# Patient Record
Sex: Female | Born: 1975 | Race: White | Hispanic: No | Marital: Married | Smoking: Never smoker
Health system: Southern US, Community
[De-identification: ages and names within clinical notes are randomized; demographics above are authoritative.]

## PROBLEM LIST (undated history)

## (undated) DIAGNOSIS — G43909 Migraine, unspecified, not intractable, without status migrainosus: Secondary | ICD-10-CM

## (undated) DIAGNOSIS — E039 Hypothyroidism, unspecified: Secondary | ICD-10-CM

## (undated) DIAGNOSIS — F419 Anxiety disorder, unspecified: Secondary | ICD-10-CM

---

## 1997-06-13 ENCOUNTER — Emergency Department (HOSPITAL_COMMUNITY): Admission: EM | Admit: 1997-06-13 | Discharge: 1997-06-13 | Payer: Self-pay | Admitting: Emergency Medicine

## 1998-08-16 ENCOUNTER — Other Ambulatory Visit: Admission: RE | Admit: 1998-08-16 | Discharge: 1998-08-16 | Payer: Self-pay | Admitting: *Deleted

## 2003-06-17 ENCOUNTER — Other Ambulatory Visit: Admission: RE | Admit: 2003-06-17 | Discharge: 2003-06-17 | Payer: Self-pay | Admitting: Obstetrics and Gynecology

## 2005-04-12 ENCOUNTER — Ambulatory Visit (HOSPITAL_BASED_OUTPATIENT_CLINIC_OR_DEPARTMENT_OTHER): Admission: RE | Admit: 2005-04-12 | Discharge: 2005-04-12 | Payer: Self-pay | Admitting: Family Medicine

## 2005-04-22 ENCOUNTER — Ambulatory Visit: Payer: Self-pay | Admitting: Internal Medicine

## 2009-02-28 ENCOUNTER — Encounter (INDEPENDENT_AMBULATORY_CARE_PROVIDER_SITE_OTHER): Payer: Self-pay | Admitting: Obstetrics & Gynecology

## 2009-02-28 ENCOUNTER — Inpatient Hospital Stay (HOSPITAL_COMMUNITY): Admission: AD | Admit: 2009-02-28 | Discharge: 2009-03-02 | Payer: Self-pay | Admitting: Obstetrics and Gynecology

## 2009-03-04 ENCOUNTER — Encounter
Admission: RE | Admit: 2009-03-04 | Discharge: 2009-04-03 | Payer: Self-pay | Source: Home / Self Care | Admitting: Certified Nurse Midwife

## 2010-04-19 LAB — CBC
MCHC: 34.2 g/dL (ref 30.0–36.0)
Platelets: 274 10*3/uL (ref 150–400)
RDW: 12.8 % (ref 11.5–15.5)

## 2010-06-16 NOTE — Procedures (Signed)
NAME:  Vicki Castro, Vicki Castro                  ACCOUNT NO.:  [id number]   MEDICAL RECORD NO.:  [id number]          PATIENT TYPE:  OUT   LOCATION:  SLEEP CENTER                 FACILITY:  [facility]   PHYSICIAN:  Clinton D. Maple Hudson, M.D. DATE OF BIRTH:  February 27, 1975   DATE OF STUDY:  04/12/2005                              NOCTURNAL POLYSOMNOGRAM   REFERRING PHYSICIAN:  Dr. Merri Brunette.   DATE OF STUDY:  April 12, 2005.   INDICATION FOR STUDY:  Hypersomnia with sleep apnea.   EPWORTH SLEEPINESS SCORE:  12/24.   BMI:  21.9.   WEIGHT:  140 pounds.   HOME MEDICATIONS:  Fluoxetine, Aviane.   SLEEP ARCHITECTURE:  Total sleep time 380 minutes with sleep efficiency 94%.  Stage I was 2%, stage II 61%, stages III and IV 14%, REM 24% of total sleep  time.  Sleep latency 19 minutes, REM latency 86 minutes, awake after sleep  onset 6 minutes, arousal index showed increased fragmentation at 33 per  hour.  No bedtime medication taken.   RESPIRATORY DATA:  Apnea/hypopnea index (AHI, RDI) 1.4 obstructive events  per hour which is within normal limits (normal range 0/5 per hour).  There  were 4 obstructive apneas and 5 hypopneas.  Most events were reported while  supine sleeping supine.  REM AHI 0.   OXYGEN DATA:  Moderate snoring and mouth breathing with oxygen desaturation  to a nadir of 94%.  Mean oxygen saturation through the study was 98% on room  air.   CARDIAC DATA:  Normal sinus rhythm.   MOVEMENT/PARASOMNIA:  A total of 351 limb jerks were reported of which 37  were associated with arousal or awakening for a periodic limb movement with  arousal index of 5.8 per hour which is increased.   IMPRESSION/RECOMMENDATIONS:  1.  Unremarkable sleep architecture except for increased brief fragmentation      most clearly associated with limb jerks.  2.  Occasional sleep disorder breathing events well within normal limits,      AHI 1.4 per hour with moderate snoring and mouth breathing, normal  oxygenation.  3.  Periodic limb movement with arousal, 5.8 per hour.  Consider specific      therapy including Requip or Mirapex if appropriate.  Recognize that      antidepressant medications will occasionally increase the frequency of      periodic limb movement.      Clinton D. Maple Hudson, M.D.  Diplomate, Biomedical engineer of Sleep Medicine  Electronically Signed     CDY/MEDQ  D:  04/22/2005 11:33:42  T:  04/24/2005 00:25:02  Job:  [id number]

## 2017-05-02 ENCOUNTER — Other Ambulatory Visit: Payer: Self-pay | Admitting: Family Medicine

## 2017-05-02 DIAGNOSIS — Z139 Encounter for screening, unspecified: Secondary | ICD-10-CM

## 2017-06-03 ENCOUNTER — Ambulatory Visit
Admission: RE | Admit: 2017-06-03 | Discharge: 2017-06-03 | Disposition: A | Discharge status: Home / Self Care | Payer: Managed Care, Other (non HMO) | Source: Ambulatory Visit | Attending: Family Medicine | Admitting: Family Medicine

## 2017-06-03 DIAGNOSIS — Z139 Encounter for screening, unspecified: Secondary | ICD-10-CM

## 2018-08-12 ENCOUNTER — Other Ambulatory Visit: Payer: Self-pay | Admitting: Family Medicine

## 2018-08-12 DIAGNOSIS — Z1231 Encounter for screening mammogram for malignant neoplasm of breast: Secondary | ICD-10-CM

## 2018-10-20 ENCOUNTER — Ambulatory Visit
Admission: RE | Admit: 2018-10-20 | Discharge: 2018-10-20 | Disposition: A | Discharge status: Home / Self Care | Payer: Self-pay | Source: Ambulatory Visit | Attending: Family Medicine | Admitting: Family Medicine

## 2018-10-20 ENCOUNTER — Other Ambulatory Visit: Payer: Self-pay

## 2018-10-20 DIAGNOSIS — Z1231 Encounter for screening mammogram for malignant neoplasm of breast: Secondary | ICD-10-CM

## 2018-10-22 ENCOUNTER — Other Ambulatory Visit: Payer: Self-pay | Admitting: Family Medicine

## 2018-10-22 DIAGNOSIS — R928 Other abnormal and inconclusive findings on diagnostic imaging of breast: Secondary | ICD-10-CM

## 2018-10-24 ENCOUNTER — Ambulatory Visit: Payer: Self-pay

## 2018-10-24 ENCOUNTER — Other Ambulatory Visit: Payer: Self-pay | Admitting: Family Medicine

## 2018-10-24 ENCOUNTER — Ambulatory Visit
Admission: RE | Admit: 2018-10-24 | Discharge: 2018-10-24 | Disposition: A | Discharge status: Home / Self Care | Payer: Managed Care, Other (non HMO) | Source: Ambulatory Visit | Attending: Family Medicine | Admitting: Family Medicine

## 2018-10-24 ENCOUNTER — Other Ambulatory Visit: Payer: Self-pay

## 2018-10-24 DIAGNOSIS — N6489 Other specified disorders of breast: Secondary | ICD-10-CM

## 2018-10-24 DIAGNOSIS — R928 Other abnormal and inconclusive findings on diagnostic imaging of breast: Secondary | ICD-10-CM

## 2018-10-31 ENCOUNTER — Other Ambulatory Visit: Payer: Self-pay

## 2018-10-31 ENCOUNTER — Ambulatory Visit
Admission: RE | Admit: 2018-10-31 | Discharge: 2018-10-31 | Disposition: A | Discharge status: Home / Self Care | Payer: Managed Care, Other (non HMO) | Source: Ambulatory Visit | Attending: Family Medicine | Admitting: Family Medicine

## 2018-10-31 DIAGNOSIS — N6489 Other specified disorders of breast: Secondary | ICD-10-CM

## 2018-11-17 ENCOUNTER — Other Ambulatory Visit: Payer: Self-pay | Admitting: Surgery

## 2018-11-17 DIAGNOSIS — D242 Benign neoplasm of left breast: Secondary | ICD-10-CM

## 2018-11-20 ENCOUNTER — Other Ambulatory Visit: Payer: Self-pay | Admitting: Surgery

## 2018-11-20 DIAGNOSIS — D242 Benign neoplasm of left breast: Secondary | ICD-10-CM

## 2018-12-09 ENCOUNTER — Other Ambulatory Visit: Payer: Self-pay

## 2018-12-09 DIAGNOSIS — Z20822 Contact with and (suspected) exposure to covid-19: Secondary | ICD-10-CM

## 2018-12-11 LAB — NOVEL CORONAVIRUS, NAA: SARS-CoV-2, NAA: DETECTED — AB

## 2018-12-24 ENCOUNTER — Other Ambulatory Visit: Payer: Self-pay

## 2018-12-24 ENCOUNTER — Encounter (HOSPITAL_COMMUNITY): Payer: Self-pay

## 2018-12-24 ENCOUNTER — Observation Stay (HOSPITAL_COMMUNITY): Payer: Managed Care, Other (non HMO)

## 2018-12-24 ENCOUNTER — Observation Stay (HOSPITAL_COMMUNITY)
Admission: EM | Admit: 2018-12-24 | Discharge: 2018-12-25 | Disposition: A | Discharge status: Home / Self Care | Payer: Managed Care, Other (non HMO) | Attending: Internal Medicine | Admitting: Internal Medicine

## 2018-12-24 ENCOUNTER — Emergency Department (HOSPITAL_COMMUNITY): Payer: Managed Care, Other (non HMO)

## 2018-12-24 ENCOUNTER — Observation Stay (HOSPITAL_COMMUNITY)
Admit: 2018-12-24 | Discharge: 2018-12-24 | Disposition: A | Discharge status: Home / Self Care | Payer: Managed Care, Other (non HMO) | Attending: Internal Medicine | Admitting: Internal Medicine

## 2018-12-24 DIAGNOSIS — Z79890 Hormone replacement therapy (postmenopausal): Secondary | ICD-10-CM | POA: Diagnosis not present

## 2018-12-24 DIAGNOSIS — R404 Transient alteration of awareness: Secondary | ICD-10-CM | POA: Diagnosis not present

## 2018-12-24 DIAGNOSIS — G43909 Migraine, unspecified, not intractable, without status migrainosus: Secondary | ICD-10-CM | POA: Diagnosis present

## 2018-12-24 DIAGNOSIS — R29818 Other symptoms and signs involving the nervous system: Secondary | ICD-10-CM | POA: Insufficient documentation

## 2018-12-24 DIAGNOSIS — R202 Paresthesia of skin: Secondary | ICD-10-CM | POA: Diagnosis not present

## 2018-12-24 DIAGNOSIS — R569 Unspecified convulsions: Secondary | ICD-10-CM | POA: Diagnosis not present

## 2018-12-24 DIAGNOSIS — E063 Autoimmune thyroiditis: Secondary | ICD-10-CM | POA: Diagnosis not present

## 2018-12-24 DIAGNOSIS — F411 Generalized anxiety disorder: Secondary | ICD-10-CM | POA: Diagnosis present

## 2018-12-24 DIAGNOSIS — R531 Weakness: Secondary | ICD-10-CM | POA: Diagnosis not present

## 2018-12-24 DIAGNOSIS — R200 Anesthesia of skin: Secondary | ICD-10-CM | POA: Diagnosis present

## 2018-12-24 DIAGNOSIS — Z79899 Other long term (current) drug therapy: Secondary | ICD-10-CM | POA: Insufficient documentation

## 2018-12-24 DIAGNOSIS — R4182 Altered mental status, unspecified: Secondary | ICD-10-CM | POA: Diagnosis not present

## 2018-12-24 DIAGNOSIS — E039 Hypothyroidism, unspecified: Secondary | ICD-10-CM | POA: Diagnosis present

## 2018-12-24 DIAGNOSIS — Z8619 Personal history of other infectious and parasitic diseases: Secondary | ICD-10-CM | POA: Diagnosis not present

## 2018-12-24 HISTORY — DX: Hypothyroidism, unspecified: E03.9

## 2018-12-24 HISTORY — DX: Anxiety disorder, unspecified: F41.9

## 2018-12-24 HISTORY — DX: Migraine, unspecified, not intractable, without status migrainosus: G43.909

## 2018-12-24 LAB — BASIC METABOLIC PANEL
Anion gap: 7 (ref 5–15)
BUN: 9 mg/dL (ref 6–20)
CO2: 24 mmol/L (ref 22–32)
Calcium: 9.1 mg/dL (ref 8.9–10.3)
Chloride: 107 mmol/L (ref 98–111)
Creatinine, Ser: 0.75 mg/dL (ref 0.44–1.00)
GFR calc Af Amer: >60 mL/min (ref >60)
GFR calc non Af Amer: >60 mL/min (ref >60)
Glucose, Bld: 109 mg/dL — ABNORMAL HIGH (ref 70–99)
Potassium: 3.6 mmol/L (ref 3.5–5.1)
Sodium: 138 mmol/L (ref 135–145)

## 2018-12-24 LAB — CBC WITH DIFFERENTIAL/PLATELET
Abs Immature Granulocytes: 0.01 10*3/uL (ref 0.00–0.07)
Basophils Absolute: 0.1 10*3/uL (ref 0.0–0.1)
Basophils Relative: 1 %
Eosinophils Absolute: 0.1 10*3/uL (ref 0.0–0.5)
Eosinophils Relative: 2 %
HCT: 42.7 % (ref 36.0–46.0)
Hemoglobin: 13.9 g/dL (ref 12.0–15.0)
Immature Granulocytes: 0 %
Lymphocytes Relative: 34 %
Lymphs Abs: 1.7 10*3/uL (ref 0.7–4.0)
MCH: 31.4 pg (ref 26.0–34.0)
MCHC: 32.6 g/dL (ref 30.0–36.0)
MCV: 96.6 fL (ref 80.0–100.0)
Monocytes Absolute: 0.5 10*3/uL (ref 0.1–1.0)
Monocytes Relative: 9 %
Neutro Abs: 2.8 10*3/uL (ref 1.7–7.7)
Neutrophils Relative %: 54 %
Platelets: 350 10*3/uL (ref 150–400)
RBC: 4.42 MIL/uL (ref 3.87–5.11)
RDW: 11.7 % (ref 11.5–15.5)
WBC: 5.2 10*3/uL (ref 4.0–10.5)
nRBC: 0 % (ref 0.0–0.2)

## 2018-12-24 LAB — URINALYSIS, ROUTINE W REFLEX MICROSCOPIC
Bilirubin Urine: NEGATIVE
Glucose, UA: NEGATIVE mg/dL
Hgb urine dipstick: NEGATIVE
Ketones, ur: 5 mg/dL — AB
Leukocytes,Ua: NEGATIVE
Nitrite: NEGATIVE
Protein, ur: NEGATIVE mg/dL
Specific Gravity, Urine: 1.01 (ref 1.005–1.030)
pH: 6 (ref 5.0–8.0)

## 2018-12-24 LAB — PHOSPHORUS: Phosphorus: 3.8 mg/dL (ref 2.5–4.6)

## 2018-12-24 LAB — HEPATIC FUNCTION PANEL
ALT: 16 U/L (ref 0–44)
AST: 17 U/L (ref 15–41)
Albumin: 4.5 g/dL (ref 3.5–5.0)
Alkaline Phosphatase: 46 U/L (ref 38–126)
Bilirubin, Direct: 0.2 mg/dL (ref 0.0–0.2)
Indirect Bilirubin: 0.7 mg/dL (ref 0.3–0.9)
Total Bilirubin: 0.9 mg/dL (ref 0.3–1.2)
Total Protein: 7.3 g/dL (ref 6.5–8.1)

## 2018-12-24 LAB — POC SARS CORONAVIRUS 2 AG -  ED: SARS Coronavirus 2 Ag: NEGATIVE

## 2018-12-24 LAB — VITAMIN B12: Vitamin B-12: 629 pg/mL (ref 180–914)

## 2018-12-24 LAB — MAGNESIUM: Magnesium: 2.1 mg/dL (ref 1.7–2.4)

## 2018-12-24 LAB — HIV ANTIBODY (ROUTINE TESTING W REFLEX): HIV Screen 4th Generation wRfx: NONREACTIVE

## 2018-12-24 LAB — TSH: TSH: 0.667 u[IU]/mL (ref 0.350–4.500)

## 2018-12-24 MED ORDER — TOPIRAMATE 25 MG PO TABS: 50.0000 mg | ORAL_TABLET | Freq: Every day | ORAL | Status: DC

## 2018-12-24 MED ORDER — ONDANSETRON HCL 4 MG PO TABS: 4.0000 mg | ORAL_TABLET | Freq: Four times a day (QID) | ORAL | Status: DC | PRN

## 2018-12-24 MED ORDER — POTASSIUM CHLORIDE CRYS ER 20 MEQ PO TBCR
40.0000 meq | EXTENDED_RELEASE_TABLET | Freq: Once | ORAL | Status: AC
  Administered 2018-12-24: 40 meq via ORAL
  Filled 2018-12-24: qty 2

## 2018-12-24 MED ORDER — ONDANSETRON HCL 4 MG/2ML IJ SOLN: 4.0000 mg | Freq: Four times a day (QID) | INTRAMUSCULAR | Status: DC | PRN

## 2018-12-24 MED ORDER — POLYETHYLENE GLYCOL 3350 17 G PO PACK: 17.0000 g | PACK | Freq: Every day | ORAL | Status: DC | PRN

## 2018-12-24 MED ORDER — ACETAMINOPHEN 650 MG RE SUPP: 650.0000 mg | Freq: Four times a day (QID) | RECTAL | Status: DC | PRN

## 2018-12-24 MED ORDER — LEVOTHYROXINE SODIUM 50 MCG PO TABS
50.0000 ug | ORAL_TABLET | Freq: Every day | ORAL | Status: DC
  Administered 2018-12-25: 50 ug via ORAL
  Filled 2018-12-24: qty 1

## 2018-12-24 MED ORDER — MAGNESIUM CITRATE PO SOLN: 1.0000 | Freq: Once | ORAL | Status: DC | PRN

## 2018-12-24 MED ORDER — ESCITALOPRAM OXALATE 10 MG PO TABS
20.0000 mg | ORAL_TABLET | Freq: Every day | ORAL | Status: DC
  Administered 2018-12-25: 20 mg via ORAL
  Filled 2018-12-24: qty 2

## 2018-12-24 MED ORDER — ADULT MULTIVITAMIN W/MINERALS CH
1.0000 | ORAL_TABLET | Freq: Every day | ORAL | Status: DC
  Administered 2018-12-24 – 2018-12-25 (×2): 1 via ORAL
  Filled 2018-12-24 (×2): qty 1

## 2018-12-24 MED ORDER — FOLIC ACID 1 MG PO TABS
1.0000 mg | ORAL_TABLET | Freq: Every day | ORAL | Status: DC
  Administered 2018-12-24 – 2018-12-25 (×2): 1 mg via ORAL
  Filled 2018-12-24 (×2): qty 1

## 2018-12-24 MED ORDER — SORBITOL 70 % SOLN: 30.0000 mL | Freq: Every day | Status: DC | PRN

## 2018-12-24 MED ORDER — GABAPENTIN 100 MG PO CAPS
100.0000 mg | ORAL_CAPSULE | Freq: Once | ORAL | Status: AC
  Administered 2018-12-24: 100 mg via ORAL

## 2018-12-24 MED ORDER — SODIUM CHLORIDE 0.9 % IV SOLN
INTRAVENOUS | Status: AC
  Administered 2018-12-24 – 2018-12-25 (×2): via INTRAVENOUS

## 2018-12-24 MED ORDER — ENOXAPARIN SODIUM 40 MG/0.4ML ~~LOC~~ SOLN
40.0000 mg | SUBCUTANEOUS | Status: DC
  Administered 2018-12-24: 40 mg via SUBCUTANEOUS
  Filled 2018-12-24: qty 0.4

## 2018-12-24 MED ORDER — ACETAMINOPHEN 325 MG PO TABS: 650.0000 mg | ORAL_TABLET | Freq: Four times a day (QID) | ORAL | Status: DC | PRN

## 2018-12-24 MED ORDER — GABAPENTIN 100 MG PO CAPS
100.0000 mg | ORAL_CAPSULE | Freq: Three times a day (TID) | ORAL | Status: DC
  Administered 2018-12-25: 100 mg via ORAL
  Filled 2018-12-24 (×3): qty 1

## 2018-12-24 MED ORDER — VITAMIN B-1 100 MG PO TABS
100.0000 mg | ORAL_TABLET | Freq: Every day | ORAL | Status: DC
  Administered 2018-12-24 – 2018-12-25 (×2): 100 mg via ORAL
  Filled 2018-12-24 (×2): qty 1

## 2018-12-24 MED ORDER — SODIUM CHLORIDE 0.9% FLUSH
3.0000 mL | Freq: Two times a day (BID) | INTRAVENOUS | Status: DC
  Administered 2018-12-24: 3 mL via INTRAVENOUS

## 2018-12-24 NOTE — Consult Note (Signed)
[facility] TeleNeurology Consult Services  Stat Consult  Date of Service:   12/24/2018 10:13:59  Impression:     .  New onset episodic paresthesias with altered awareness, suggestive of seizures.  Comments/Sign-Out: 43 year old female presents for a neurological evaluation. On 12/22/18, she awoke in the early morning with a "buzzing" sensation of the head associated with "hyper-awareness" and bilateral arm tingling. This lasted about a minute. Since then, she has had intermittent bilateral arm tingling, hearing a high pitched sound. Yesterday she had a staring episode while hearing a buzzing sound. This was associated with right arm tingling and lasted for about 10 seconds and she felt tired afterwards. MRI brain and EEG are pending.  CT HEAD: Showed No Acute Hemorrhage or Acute Core Infarct  Metrics: [facility] Notification Time: 12/24/2018 10:11:06 Stamp Time: 12/24/2018 10:13:59 Callback Response Time: 12/24/2018 10:15:11 Video Start Time: 12/24/2018 10:55:32  Our recommendations are outlined below.  Recommendations:     .  EEG   Imaging Studies:     .  MRI Head  Therapies:     .  Physical Therapy  Disposition: Neurology Follow Up Recommended  Sign Out:     .  Discussed with Emergency Department Provider  ----------------------------------------------------------------------------------------------------  Chief Complaint: tingling in the extremities.  History of Present Illness: Patient is a 43 year old Female.  43 year old female presents for a neurological evaluation. On 12/22/18, she awoke in the early morning with a "buzzing" sensation of the head associated with "hyper-awareness" and bilateral arm tingling. This lasted about a minute. Since then, she has had intermittent bilateral arm tingling, hearing a high pitched sound. Yesterday she had a staring episode while hearing a buzzing sound. This was associated with right arm tingling and  lasted for about 10 seconds and she felt tired afterwards. Her past medical history is significant for Hashimoto's thyroiditis, migraines and an anxiety disorder. Her PCP recently prescribed gabapentin for paresthesias of the extremities for about 12/17/18.       Examination: BP(107/73), Pulse(77), Blood Glucose(109)  Neuro Exam:  General: Alert,Awake, Oriented to Time, Place, Person  Speech: Fluent:  Language: Intact:  Face: Symmetric: Right, Left  Facial Sensation: Intact: Right, Left  Visual Fields: Intact:  Extraocular Movements: Intact: Right, Left  Motor Exam: No Drift: RUE, RLE, LUE, LLE  Sensation: Intact: RUE, RLE, LUE, LLE  Coordination: Intact: RUE  Mild left leg drift noted.  Patient/Family was informed the Neurology Consult would happen via TeleHealth consult by way of interactive audio and video telecommunications and consented to receiving care in this manner.  Due to the immediate potential for life-threatening deterioration due to underlying acute neurologic illness, I spent 35 minutes providing critical care. This time includes time for face to face visit via telemedicine, review of medical records, imaging studies and discussion of findings with providers, the patient and/or family.   Dr Ellin Goodie   [facility] [phone]  Case [id number]

## 2018-12-24 NOTE — H&P (Signed)
History and Physical    SANAYAH MUNRO AJO:[id number] DOB: 1975/07/10 DOA: 12/24/2018  PCP: Chesley Noon, MD  Patient coming from: Home  I have personally briefly reviewed patient's old medical records in [place]  Chief Complaint: New onset paresthesias, concern for possible seizures, episodic numbness and tingling.  HPI: TEKELA GARGUILO is a 43 y.o. female with medical history significant of recently diagnosed Covid on 12/09/2018, hypothyroidism, history of Hashimoto's, generalized anxiety disorder, migraines who presents to the ED with complaints of buzzing in the head, bilateral tingling in the upper extremities which started 12/20/2018, an episode of staring where she felt she was present yet not present which lasted approximately a minute, increased hyper awareness, right shoulder tingling and right upper extremity transient weakness.  Patient states husband got diagnosed with Covid positive on 12/08/2018 and patient subsequently diagnosed positive for Covid 12/09/2018 after which she self quarantined.  Patient endorses some chills, a bout of emesis, nausea.  Patient denies any fevers, no chest pain, no shortness of breath, no abdominal pain, no melena, no hematemesis, no hematochezia, no syncope.  Patient stated was started on some Neurontin by her PCP which helped somewhat with her paresthesias.  Patient with complaints of significant fatigue and headache after being diagnosed with Covid.  Patient stated had some flulike symptoms as well.  ED Course: Patient seen in the ED, comprehensive metabolic profile obtained unremarkable.  CBC obtained unremarkable.  Repeat SARS-CoV-2 pending.  Chest x-ray negative for any acute abnormalities.  Head CT done which was negative.  Telemetry neurology was consulted by ED physician.  Neuro hospitalist also consulted by ED physician who recommended EEG and MRI of the brain for further work-up.  Hospitalist were called to admit the patient for further  evaluation and management.  Review of Systems: As per HPI otherwise 10 point review of systems negative.   Past Medical History:  Diagnosis Date   Anxiety    Hypothyroid    Migraines     History reviewed. No pertinent surgical history.   reports that she has never smoked. She has never used smokeless tobacco. She reports current alcohol use. No history on file for drug.  No Known Allergies  Family History  Problem Relation Age of Onset   Hypertension Mother    Liver disease Mother    Hypertension Father    Mother deceased age 57 from liver failure.  Father alive with history of hypertension.  Prior to Admission medications   Medication Sig Start Date End Date Taking? Authorizing Provider  escitalopram (LEXAPRO) 20 MG tablet Take 20 mg by mouth daily. 10/09/18   [provider]  gabapentin (NEURONTIN) 100 MG capsule Take 100 mg by mouth 3 (three) times daily. 12/17/18   [provider]  levothyroxine (SYNTHROID) 50 MCG tablet Take 50 mcg by mouth daily. 11/16/18   [provider]  topiramate (TOPAMAX) 50 MG tablet Take 50 mg by mouth daily. 11/03/18   [provider]    Physical Exam: Vitals:   12/24/18 0930 12/24/18 1030 12/24/18 1115 12/24/18 1230  BP: 107/73 110/72  110/72  Pulse: 77 72 82 82  Resp: _0 Temp:      TempSrc:      SpO2: 99% 99% 100% 100%  Weight:      Height:        Constitutional: NAD, calm, comfortable Vitals:   12/24/18 0930 12/24/18 1030 12/24/18 1115 12/24/18 1230  BP: 107/73 110/72  110/72  Pulse:  77 72 82 82  Resp: _0 Temp:      TempSrc:      SpO2: 99% 99% 100% 100%  Weight:      Height:       Eyes: PERRL, lids and conjunctivae normal ENMT: Mucous membranes are moist. Posterior pharynx clear of any exudate or lesions.Normal dentition.  Neck: normal, supple, no masses, no thyromegaly Respiratory: clear to auscultation bilaterally, no wheezing, no crackles. Normal respiratory  effort. No accessory muscle use.  Cardiovascular: Regular rate and rhythm, no murmurs / rubs / gallops. No extremity edema. 2+ pedal pulses. No carotid bruits.  Abdomen: no tenderness, no masses palpated. No hepatosplenomegaly. Bowel sounds positive.  Musculoskeletal: no clubbing / cyanosis. No joint deformity upper and lower extremities. Good ROM, no contractures. Normal muscle tone.  Skin: no rashes, lesions, ulcers. No induration Neurologic: CN 2-12 grossly intact. Sensation intact, DTR normal. Strength 5/5 in all 4.  Psychiatric: Normal judgment and insight. Alert and oriented x 3. Normal mood.   Labs on Admission: I have personally reviewed following labs and imaging studies  CBC: Recent Labs  Lab 12/24/18 0840  WBC 5.2  NEUTROABS 2.8  HGB 13.9  HCT 42.7  MCV 96.6  PLT 476   Basic Metabolic Panel: Recent Labs  Lab 12/24/18 0840  NA 138  K 3.6  CL 107  CO2 24  GLUCOSE 109*  BUN 9  CREATININE 0.75  CALCIUM 9.1   GFR: Estimated Creatinine Clearance: 90 mL/min (by C-G formula based on SCr of 0.75 mg/dL). Liver Function Tests: Recent Labs  Lab 12/24/18 0840  AST 17  ALT 16  ALKPHOS 46  BILITOT 0.9  PROT 7.3  ALBUMIN 4.5   No results for input(s): LIPASE, AMYLASE in the last 168 hours. No results for input(s): AMMONIA in the last 168 hours. Coagulation Profile: No results for input(s): INR, PROTIME in the last 168 hours. Cardiac Enzymes: No results for input(s): CKTOTAL, CKMB, CKMBINDEX, TROPONINI in the last 168 hours. BNP (last 3 results) No results for input(s): PROBNP in the last 8760 hours. HbA1C: No results for input(s): HGBA1C in the last 72 hours. CBG: No results for input(s): GLUCAP in the last 168 hours. Lipid Profile: No results for input(s): CHOL, HDL, LDLCALC, TRIG, CHOLHDL, LDLDIRECT in the last 72 hours. Thyroid Function Tests: No results for input(s): TSH, T4TOTAL, FREET4, T3FREE, THYROIDAB in the last 72 hours. Anemia Panel: No results  for input(s): VITAMINB12, FOLATE, FERRITIN, TIBC, IRON, RETICCTPCT in the last 72 hours. Urine analysis: No results found for: COLORURINE, APPEARANCEUR, LABSPEC, [place], GLUCOSEU, [place], BILIRUBINUR, KETONESUR, PROTEINUR, UROBILINOGEN, NITRITE, LEUKOCYTESUR  Radiological Exams on Admission: Ct Head Wo Contrast  Result Date: 12/24/2018 CLINICAL DATA:  Possible seizure. EXAM: CT HEAD WITHOUT CONTRAST TECHNIQUE: Contiguous axial images were obtained from the base of the skull through the vertex without intravenous contrast. COMPARISON:  None. FINDINGS: Brain: No evidence of acute infarction, hemorrhage, hydrocephalus, extra-axial collection or mass lesion/mass effect. Vascular: No hyperdense vessel or unexpected calcification. Skull: Normal. Negative for fracture or focal lesion. Sinuses/Orbits: No acute finding. Partial opacification of the right mastoid air cells. Other: None. IMPRESSION: 1. Normal noncontrast head CT. Electronically Signed   By: Titus Dubin M.D.   On: 12/24/2018 10:20   Dg Chest Portable 1 View  Result Date: 12/24/2018 CLINICAL DATA:  Recent COVID-19 positive. Extremity tingling sensation. Headache. EXAM: PORTABLE CHEST 1 VIEW COMPARISON:  None. FINDINGS: Lungs are clear. Heart size and pulmonary vascularity are normal. No  adenopathy. No bone lesions. IMPRESSION: No edema or consolidation.  No evident adenopathy. Electronically Signed   By: Lowella Grip III M.D.   On: 12/24/2018 10:06    EKG: Independently reviewed.  Normal sinus rhythm.  No ischemic changes noted.  Assessment/Plan Principal Problem:   Altered mental status Active Problems:   Paresthesia of upper extremity   Numbness and tingling of both upper extremities   Hypothyroidism   Migraine headache   GAD (generalized anxiety disorder)   1 altered mental status/paresthesias of upper extremities/numbness and tingling/seizure-like activity Questionable etiology.  Patient presenting with a myriad of  neurological symptoms of paresthesias tingling and numbness as well as a staring spell with concern for possible seizure activity as well as complaints of a buzzing in the head.  Patient with no focal neurological deficits.  Head CT done on admission negative.  Patient with no signs or symptoms of infection.  Patient consulted on by neurology who are recommending EEG and MRI for further work-up.  Will check RPR, HIV, vitamin B12 levels, TSH.  Supportive care.  Continue home regimen Neurontin however may need to increase dose to 300 mg 3 times daily however will defer to neurology.  Appreciate neurology input and recommendations.  2.  Hypothyroidism Check a TSH.  Continue home regimen Synthroid.  3.  Generalized anxiety disorder Continue home regimen Lexapro.  DVT prophylaxis: Lovenox Code Status: Full Family Communication: Updated patient.  No family at bedside. Disposition Plan: Likely home when clinically improved and cleared by neurology Consults called: Neurology Admission status: Admit to observation/telemetry.   Irine Seal MD [facility]  If 7PM-7AM, please contact night-coverage [email or web address]  12/24/2018, 1:18 PM

## 2018-12-24 NOTE — ED Notes (Signed)
Patient transported to MRI 

## 2018-12-24 NOTE — Procedures (Signed)
Patient Name: Vicki Castro  MRN: [id number]  Epilepsy Attending: Lora Havens  Referring Physician/Provider: Dr Milly Jakob Date: 12/24/2018 Duration: 22.44mins  Patient history: 43yo F with an episode of staring. EEG to evaluate for seizure  Level of alertness: awake  AEDs during EEG study: TPM, GBP  Technical aspects: This EEG study was done with scalp electrodes positioned according to the 10-20 International system of electrode placement. Electrical activity was acquired at a sampling rate of 500Hz  and reviewed with a high frequency filter of 70Hz  and a low frequency filter of 1Hz . EEG data were recorded continuously and digitally stored.   DESCRIPTION: The posterior dominant rhythm consists of 9-10 Hz activity of moderate voltage (25-35 uV) seen predominantly in posterior head regions, symmetric and reactive to eye opening and eye closing. Hyperventilation and photic stimulation were not performed.  IMPRESSION: This study is within normal limits. No seizures or epileptiform discharges were seen throughout the recording.   However, only wakefulness was recorded. If suspicion for interictal activity remains a concern, a prolonged study including sleep, photic stimulation and HV can be considered.    Draden Cottingham Barbra Sarks

## 2018-12-24 NOTE — Consult Note (Addendum)
Neurology Consultation  Reason for Consult: Multiple symptoms including numbness, tingling, weakness and mild headache along with one episode of staring spell  Referring Physician: Dr. Grandville Silos   History is obtained from: Patient  HPI: EMAH DAHIR is a 43 y.o. female with history of anxiety and Hashimoto thyroiditis along with migraine headaches.  Patient contracted a "mild case" of Covid 2 weeks ago.  She states she did not have any symptoms prior to this.  After contracting Covid, approximately 4 days after she noted that her left leg had tingling and weakness, in addition noted bilateral tingling in her fingers and hands.  She called her PCP who prescribed her Neurontin for the paresthesias in her hands and left leg.  She now walks with a cane as she feels as though her left leg is still weak.  Later in the week patient awoke at night feeling a "out of body experience" and "hypervigilant sensation" which lasted for approximately 1 minute, however she did not get out of bed for the rest of the day.  The next day 11/22 she was fine.  The following day she had to further episodes very similar however she had numbness throughout her face, and a ringing sensation in her ears.  Again this lasted for approximately 1 to 2 minutes and dissipated.  From this point on she states that every episode she had a ringing sensation in bilateral ears.  The following day she had 2 events that were very similar to the previous events, but this time she had numbness on her inner thighs, tingling bilateral hands and states she was talking to her son when she felt she had a very transient possibly "10 seconds" in which she could hear him but just did not respond.  Due to having another event this morning patient came into the hospital to be evaluated.  She admits that she has been googling "ringing in ears, head buzz, tingling in body post Covid".  She states she is very nervous about this.  She feels nervous this is a post  Covid affect.  She denies this being anywhere like her normal migraines.  When asked if she does have a headache she states only once and it was very mild and not like her normal migraines.   ED course head CT without contrast, labs   Chart review-she is seen by [facility] neurology for migraines.   Past Medical History:  Diagnosis Date  . Anxiety   . Hypothyroid   . Migraines    Family History  Problem Relation Age of Onset  . Hypertension Mother   . Hypertension Father    Social History:   reports that she has never smoked. She has never used smokeless tobacco. She reports current alcohol use. No history on file for drug.  Medications No current facility-administered medications for this encounter.   Current Outpatient Medications:  .  escitalopram (LEXAPRO) 20 MG tablet, Take 20 mg by mouth daily., Disp: , Rfl:  .  gabapentin (NEURONTIN) 100 MG capsule, Take 100 mg by mouth 3 (three) times daily., Disp: , Rfl:  .  levothyroxine (SYNTHROID) 50 MCG tablet, Take 50 mcg by mouth daily., Disp: , Rfl:  .  topiramate (TOPAMAX) 50 MG tablet, Take 50 mg by mouth daily., Disp: , Rfl:    Exam: Current vital signs: BP 110/72   Pulse 82   Temp 98.1 F (36.7 C) (Oral)   Resp 19   Ht 5\' 7"  (1.702 m)   Wt 72.6  kg   LMP 12/10/2018 (Approximate)   SpO2 100%   BMI 25.06 kg/m  Vital signs in last 24 hours: Temp:  [98.1 F (36.7 C)] 98.1 F (36.7 C) (11/25 0746) Pulse Rate:  [71-94] 82 (11/25 1115) Resp:  [17-20] 19 (11/25 1115) BP: (107-129)/(72-83) 110/72 (11/25 1030) SpO2:  [99 %-100 %] 100 % (11/25 1115) Weight:  [72.6 kg] 72.6 kg (11/25 0832)  ROS:    General ROS: negative for - chills, fatigue, fever, night sweats, weight gain or weight loss Psychological ROS: Positive for -  memory difficulties since Covid was acquired Ophthalmic ROS: negative for - blurry vision, double vision, eye pain or loss of vision ENT ROS: negative for - epistaxis, nasal discharge, oral lesions,  sore throat, tinnitus or vertigo Respiratory ROS: negative for - cough, hemoptysis, shortness of breath or wheezing Cardiovascular ROS: negative for - chest pain, dyspnea on exertion, edema or irregular heartbeat Gastrointestinal ROS: negative for - abdominal pain, diarrhea, hematemesis, nausea/vomiting or stool incontinence Genito-Urinary ROS: negative for - dysuria, hematuria, incontinence or urinary frequency/urgency Musculoskeletal ROS: negative for -  muscular weakness Neurological ROS: as noted in HPI Dermatological ROS: negative for rash and skin lesion changes   Physical Exam   Constitutional: Appears well-developed and well-nourished.  Psych: Appears to be anxious Eyes: No scleral injection HENT: No OP obstrucion Head: Normocephalic.  Cardiovascular: Normal rate and regular rhythm.  Respiratory: Effort normal, non-labored breathing GI: Soft.  No distension. There is no tenderness.  Skin: WDI  Neuro: Mental Status: Patient is awake, alert, oriented to person, place, month, year, and situation. Patient is able to give a clear and coherent history. No signs of aphasia or neglect Cranial Nerves: II: Visual Fields are full.  III,IV, VI: EOMI without ptosis or diploplia. Pupils equal, round and reactive to light V: Facial sensation is symmetric to temperature VII: Facial movement is symmetric.  VIII: hearing is intact to voice X: Palat elevates symmetrically XI: Shoulder shrug is symmetric. XII: tongue is midline without atrophy or fasciculations.  Motor: Tone is normal. Bulk is normal. 5/5 strength was present in all four extremities.  Sensory: Sensation is symmetric to light touch and temperature in the arms and legs. Deep Tendon Reflexes: 2+ and symmetric in the biceps and patellae.  Plantars: Toes are downgoing bilaterally.  Cerebellar: FNF and HKS are intact bilaterally  Labs I have reviewed labs in epic and the results pertinent to this consultation  are:   CBC    Component Value Date/Time   WBC 5.2 12/24/2018 0840   RBC 4.42 12/24/2018 0840   HGB 13.9 12/24/2018 0840   HCT 42.7 12/24/2018 0840   PLT 350 12/24/2018 0840   MCV 96.6 12/24/2018 0840   MCH 31.4 12/24/2018 0840   MCHC 32.6 12/24/2018 0840   RDW 11.7 12/24/2018 0840   LYMPHSABS 1.7 12/24/2018 0840   MONOABS 0.5 12/24/2018 0840   EOSABS 0.1 12/24/2018 0840   BASOSABS 0.1 12/24/2018 0840    CMP     Component Value Date/Time   NA 138 12/24/2018 0840   K 3.6 12/24/2018 0840   CL 107 12/24/2018 0840   CO2 24 12/24/2018 0840   GLUCOSE 109 (H) 12/24/2018 0840   BUN 9 12/24/2018 0840   CREATININE 0.75 12/24/2018 0840   CALCIUM 9.1 12/24/2018 0840   PROT 7.3 12/24/2018 0840   ALBUMIN 4.5 12/24/2018 0840   AST 17 12/24/2018 0840   ALT 16 12/24/2018 0840   ALKPHOS 46 12/24/2018 0840   BILITOT  0.9 12/24/2018 0840   GFRNONAA >60 12/24/2018 0840   GFRAA >60 12/24/2018 0840    Lipid Panel  No results found for: CHOL, TRIG, HDL, CHOLHDL, VLDL, LDLCALC, LDLDIRECT   Imaging I have reviewed the images obtained:  CT-scan of the brain-normal  MRI examination of the brain-pending  EEG-pending  Etta Quill PA-C [facility] [phone]  M-F  (9:00 am- 5:00 PM)  12/24/2018, 12:37 PM   I have seen the patient and reviewed the above note  Assessment:  43 year old female with multiple symptoms including paresthesias, ringing in her ears, weakness in both leg and arm and memory difficulties since acquiring Covid.    The paresthesias I think are likely secondary to Topamax.  Of note, the other symptoms started only a few days after starting gabapentin as treatment for the paresthesias, but would be unusuakl side effects of this especially at the low dose that she is taking.   I think a significant possibility for the recurrent episodes is migraine aura, with the more persistent paresthesias secondary to Topamax.  I would favor stopping both the  Topamax and the gabapentin which was started in response to a symptom that I think was likely secondary to Topamax.    Impression: At this time differential is very broad including: -Possible seizure -Possible migraine headache -Possible anxiety  Recommendations: -MRI brain without contrast -EEG -Discontinue Topamax and gabapentin  -We will follow up diagnostic tests and make further recommendations  Roland Rack, MD [facility] [phone]  If 7pm- 7am, please page neurology on call as listed in [place].

## 2018-12-24 NOTE — ED Provider Notes (Signed)
[facility] DEPT Provider Note   CSN: [id number] Arrival date & time: 12/24/18  [id number]     History   Chief Complaint Chief Complaint  Patient presents with  . Neurologic Problem    recent covid  . Covid +    HPI Vicki Castro is a 42 y.o. female.     Patient with history of headaches, recent Covid who presents the ED with concern for seizure-like activity over the last several days.  Has had some episodes of paresthesias to the right upper extremity with mild headaches with staring off spells.  Denies any loss of consciousness, no chest pain, no shortness of breath, no cough.  Headaches do not feel like her typical migraines.  Primary care doctor has tried to have her follow-up with neurology but she has had more frequent spells.  The history is provided by the patient.  Neurologic Problem This is a new problem. The current episode started more than 1 week ago. The problem occurs every several days. Associated symptoms include headaches. Pertinent negatives include no chest pain, no abdominal pain and no shortness of breath. Nothing aggravates the symptoms. Nothing relieves the symptoms. She has tried nothing for the symptoms. The treatment provided no relief.    History reviewed. No pertinent past medical history.  There are no active problems to display for this patient.      OB History   No obstetric history on file.      Home Medications    Prior to Admission medications   Not on File    Family History History reviewed. No pertinent family history.  Social History Social History   Tobacco Use  . Smoking status: Never Smoker  . Smokeless tobacco: Never Used  Substance Use Topics  . Alcohol use: Not on file  . Drug use: Not on file     Allergies   Patient has no known allergies.   Review of Systems Review of Systems  Constitutional: Negative for chills and fever.  HENT: Negative for ear pain and sore throat.   Eyes:  Negative for pain and visual disturbance.  Respiratory: Negative for cough and shortness of breath.   Cardiovascular: Negative for chest pain and palpitations.  Gastrointestinal: Negative for abdominal pain and vomiting.  Genitourinary: Negative for dysuria and hematuria.  Musculoskeletal: Negative for arthralgias and back pain.  Skin: Negative for color change and rash.  Neurological: Positive for speech difficulty, weakness, light-headedness, numbness and headaches. Negative for seizures and syncope.  All other systems reviewed and are negative.    Physical Exam Updated Vital Signs BP 110/72   Pulse 82   Temp 98.1 F (36.7 C) (Oral)   Resp 19   Ht 5\' 7"  (1.702 m)   Wt 72.6 kg   LMP 12/10/2018 (Approximate)   SpO2 100%   BMI 25.06 kg/m   Physical Exam Vitals signs and nursing note reviewed.  Constitutional:      General: She is not in acute distress.    Appearance: She is well-developed.  HENT:     Head: Normocephalic and atraumatic.     Nose: Nose normal.     Mouth/Throat:     Mouth: Mucous membranes are moist.  Eyes:     Extraocular Movements: Extraocular movements intact.     Conjunctiva/sclera: Conjunctivae normal.     Pupils: Pupils are equal, round, and reactive to light.  Neck:     Musculoskeletal: Normal range of motion and neck supple.  Cardiovascular:  Rate and Rhythm: Normal rate and regular rhythm.     Pulses: Normal pulses.     Heart sounds: Normal heart sounds. No murmur.  Pulmonary:     Effort: Pulmonary effort is normal. No respiratory distress.     Breath sounds: Normal breath sounds.  Abdominal:     Palpations: Abdomen is soft.     Tenderness: There is no abdominal tenderness.  Skin:    General: Skin is warm and dry.  Neurological:     General: No focal deficit present.     Mental Status: She is alert and oriented to person, place, and time.     Cranial Nerves: No cranial nerve deficit.     Sensory: No sensory deficit.     Motor: No  weakness.     Coordination: Coordination normal.     Gait: Gait normal.     Comments: 5+ out of 5 strength, normal sensation, normal finger-nose-finger, normal speech,       ED Treatments / Results  Labs (all labs ordered are listed, but only abnormal results are displayed) Labs Reviewed  BASIC METABOLIC PANEL - Abnormal; Notable for the following components:      Result Value   Glucose, Bld 109 (*)    All other components within normal limits  CBC WITH DIFFERENTIAL/PLATELET  HEPATIC FUNCTION PANEL  URINALYSIS, ROUTINE W REFLEX MICROSCOPIC    EKG EKG Interpretation  Date/Time:  Wednesday December 24 2018 08:46:32 EST Ventricular Rate:  75 PR Interval:    QRS Duration: 109 QT Interval:  371 QTC Calculation: 415 R Axis:   65 Text Interpretation: Sinus rhythm RSR' in V1 or V2, right VCD or RVH Baseline wander in lead(s) I III aVL Confirmed by Lennice Sites [phone]) on 12/24/2018 9:09:49 AM   Radiology Ct Head Wo Contrast  Result Date: 12/24/2018 CLINICAL DATA:  Possible seizure. EXAM: CT HEAD WITHOUT CONTRAST TECHNIQUE: Contiguous axial images were obtained from the base of the skull through the vertex without intravenous contrast. COMPARISON:  None. FINDINGS: Brain: No evidence of acute infarction, hemorrhage, hydrocephalus, extra-axial collection or mass lesion/mass effect. Vascular: No hyperdense vessel or unexpected calcification. Skull: Normal. Negative for fracture or focal lesion. Sinuses/Orbits: No acute finding. Partial opacification of the right mastoid air cells. Other: None. IMPRESSION: 1. Normal noncontrast head CT. Electronically Signed   By: Titus Dubin M.D.   On: 12/24/2018 10:20   Dg Chest Portable 1 View  Result Date: 12/24/2018 CLINICAL DATA:  Recent COVID-19 positive. Extremity tingling sensation. Headache. EXAM: PORTABLE CHEST 1 VIEW COMPARISON:  None. FINDINGS: Lungs are clear. Heart size and pulmonary vascularity are normal. No adenopathy. No bone  lesions. IMPRESSION: No edema or consolidation.  No evident adenopathy. Electronically Signed   By: Lowella Grip III M.D.   On: 12/24/2018 10:06    Procedures Procedures (including critical care time)  Medications Ordered in ED Medications - No data to display   Initial Impression / Assessment and Plan / ED Course  I have reviewed the triage vital signs and the nursing notes.  Pertinent labs & imaging results that were available during my care of the patient were reviewed by me and considered in my medical decision making (see chart for details).        JHOANNA HELMINSKI is a 43 year old female with history of headaches, recent coronavirus who presents to the ED with concern for seizures.  Patient with normal vitals.  No fever.  Has had several neurologic type events over the last several  days since being diagnosed with coronavirus.  Has history of migraines and states that symptoms are not consistent with her typical migraines.  She has had some intermittent paresthesias, speech difficulty, staring off spells.  Possibly patient with complex migraine versus TIA versus focal seizures.  Possibly could be functional/anxiety.  Will evaluate with lab work, CT head.  EKG.  Neurologically unremarkable on exam.  We will have her evaluated by neurology.  Lab work showed no significant anemia, electrolyte abnormality, kidney injury.  CT head showed no acute findings.  Chest x-ray with no signs of infection.  Have consulted neurology for further assistance with patient's care.  Suspect possible complex migraine versus TIA versus focal seizures.  Neurology recommends admission for EEG, MRI.  Possible stroke versus seizure.  Will test for coronavirus again as recently tested positive about 2 weeks ago.  However does not have any respiratory symptoms.  Chest x-ray was unremarkable.  This chart was dictated using voice recognition software.  Despite best efforts to proofread,  errors can occur which can  change the documentation meaning.    Final Clinical Impressions(s) / ED Diagnoses   Final diagnoses:  Seizure-like activity [facility])    ED Discharge Orders    None       Lennice Sites, DO 12/24/18 1118

## 2018-12-24 NOTE — ED Triage Notes (Signed)
Pt reports she states she is woken up with buzzing sensation in head and tingling in extremities, also states she has some numbness in scalp followed by mild headache that resolves quickly.

## 2018-12-24 NOTE — ED Notes (Signed)
EEG being performed in rm

## 2018-12-24 NOTE — Progress Notes (Signed)
EEG Completed; Results Pending  

## 2018-12-25 DIAGNOSIS — R202 Paresthesia of skin: Secondary | ICD-10-CM | POA: Diagnosis not present

## 2018-12-25 DIAGNOSIS — R200 Anesthesia of skin: Secondary | ICD-10-CM | POA: Diagnosis not present

## 2018-12-25 LAB — CBC
HCT: 44.9 % (ref 36.0–46.0)
Hemoglobin: 14.3 g/dL (ref 12.0–15.0)
MCH: 31.6 pg (ref 26.0–34.0)
MCHC: 31.8 g/dL (ref 30.0–36.0)
MCV: 99.3 fL (ref 80.0–100.0)
Platelets: 350 10*3/uL (ref 150–400)
RBC: 4.52 MIL/uL (ref 3.87–5.11)
RDW: 11.6 % (ref 11.5–15.5)
WBC: 6.5 10*3/uL (ref 4.0–10.5)
nRBC: 0 % (ref 0.0–0.2)

## 2018-12-25 LAB — GLUCOSE, CAPILLARY: Glucose-Capillary: 101 mg/dL — ABNORMAL HIGH (ref 70–99)

## 2018-12-25 LAB — COMPREHENSIVE METABOLIC PANEL
ALT: 16 U/L (ref 0–44)
AST: 18 U/L (ref 15–41)
Albumin: 4.3 g/dL (ref 3.5–5.0)
Alkaline Phosphatase: 46 U/L (ref 38–126)
Anion gap: 9 (ref 5–15)
BUN: 10 mg/dL (ref 6–20)
CO2: 21 mmol/L — ABNORMAL LOW (ref 22–32)
Calcium: 8.9 mg/dL (ref 8.9–10.3)
Chloride: 109 mmol/L (ref 98–111)
Creatinine, Ser: 0.63 mg/dL (ref 0.44–1.00)
GFR calc Af Amer: >60 mL/min (ref >60)
GFR calc non Af Amer: >60 mL/min (ref >60)
Glucose, Bld: 93 mg/dL (ref 70–99)
Potassium: 3.6 mmol/L (ref 3.5–5.1)
Sodium: 139 mmol/L (ref 135–145)
Total Bilirubin: 0.6 mg/dL (ref 0.3–1.2)
Total Protein: 7.1 g/dL (ref 6.5–8.1)

## 2018-12-25 LAB — URINE CULTURE: Culture: NO GROWTH

## 2018-12-25 LAB — SARS CORONAVIRUS 2 (TAT 6-24 HRS): SARS Coronavirus 2: NEGATIVE

## 2018-12-25 LAB — RPR: RPR Ser Ql: NONREACTIVE

## 2018-12-25 NOTE — Discharge Summary (Signed)
Physician Discharge Summary  Vicki Castro [id number] DOB: 29-Dec-1975 DOA: 12/24/2018  PCP: Chesley Noon, MD  Admit date: 12/24/2018 Discharge date: 12/25/2018  Admitted From: Home Disposition: Home  Recommendations for Outpatient Follow-up:  1. Follow up with PCP in 1-2 weeks 2. Please obtain BMP/CBC in one week 3. Please follow up on the following pending results:  Home Health: None Equipment/Devices: None  Discharge Condition: Fair CODE STATUS: Full code Diet recommendation: Regular    Discharge Diagnoses:  Principal Problem:   Paresthesia of upper extremity   Active Problems:   Numbness and tingling of both upper extremities   Hypothyroidism   Migraine headache   GAD (generalized anxiety disorder)  Brief narrative/HPI 43 year old female with history of recently diagnosed Covid 19 infection 12/09/2018, hypothyroidism, generalized anxiety disorder, migraine headaches who presented to the ED with diffuse neurological symptoms including bilateral tingling in the upper extremities, buzzing sound in the head and an episode of vague stating for almost a minute.  Patient self quarantine after being diagnosed with COVID-19 infection recently and had some nausea, weakness and some chills but no fever, respiratory symptoms, abdominal pain or diarrhea.  Patient was started on Neurontin by her PCP for paresthesia symptoms and she reports being on Topamax for past few months for migraine prophylaxis. In the ED vitals and labs were stable.  Chest x-ray negative for infection.  Head CT was unremarkable.  Teleneurology was consulted who recommended for EEG and MRI and observation.  Hospital course Upper extremity paresthesia Associated with mental fogginess, weakness of arms and legs and ringing in the ears.  Neurology consult appreciated.  Suspect this may be due to Topamax and could also be due to gabapentin as her symptoms started few days after it was started for her  paresthesias. MRI of the brain negative for stroke or any acute abnormality.  EEG without any epileptiform discharge.  Her symptoms have mostly resolved today and patient is able to ambulate without difficulty.  TSH, B12 were negative. Neurology recommended stopping both Topamax and gabapentin.  However patient reports that Topamax has helped her significantly with migraine prophylaxis.  I discussed with the neurologist who recommends to put her back on low-dose Topamax (25 mg daily) and stop gabapentin.  If she has recurrence of paresthesias she needs to talk to her neurologist. Remaining medical issues are stable.  Patient stable to be discharged home with outpatient follow-up with her neurologist  Procedure: Head CT, MRI brain, EEG Consult: Neurology (Dr. Leonel Ramsay)  Disposition: Home  Discharge Instructions   Allergies as of 12/25/2018      Reactions   Gluten Meal Other (See Comments)   Migraine   Lactose Intolerance (gi) Other (See Comments)   Migraine      Medication List    STOP taking these medications   gabapentin 100 MG capsule Commonly known as: NEURONTIN     TAKE these medications   b complex vitamins tablet Take 2 tablets by mouth daily.   cholecalciferol 25 MCG (1000 UT) tablet Commonly known as: VITAMIN D3 Take 5,000 Units by mouth daily.   escitalopram 20 MG tablet Commonly known as: LEXAPRO Take 20 mg by mouth daily.   levothyroxine 50 MCG tablet Commonly known as: SYNTHROID Take 50 mcg by mouth daily.   SELENIUM PO Take 1 tablet by mouth daily.   topiramate 50 MG tablet Commonly known as: TOPAMAX Take 25 mg by mouth every evening.   vitamin C 500 MG tablet Commonly known as: ASCORBIC ACID Take  2,000 mg by mouth 2 (two) times daily.   Zinc-220 220 (50 Zn) MG capsule Generic drug: zinc sulfate Take 220 mg by mouth daily.      Follow-up Information    Chesley Noon, MD. Schedule an appointment as soon as possible for a visit in 1  week(s).   Specialty: Family Medicine Contact information: [place] [address] [phone]          Allergies  Allergen Reactions  . Gluten Meal Other (See Comments)    Migraine  . Lactose Intolerance (Gi) Other (See Comments)    Migraine     Procedures/Studies: Ct Head Wo Contrast  Result Date: 12/24/2018 CLINICAL DATA:  Possible seizure. EXAM: CT HEAD WITHOUT CONTRAST TECHNIQUE: Contiguous axial images were obtained from the base of the skull through the vertex without intravenous contrast. COMPARISON:  None. FINDINGS: Brain: No evidence of acute infarction, hemorrhage, hydrocephalus, extra-axial collection or mass lesion/mass effect. Vascular: No hyperdense vessel or unexpected calcification. Skull: Normal. Negative for fracture or focal lesion. Sinuses/Orbits: No acute finding. Partial opacification of the right mastoid air cells. Other: None. IMPRESSION: 1. Normal noncontrast head CT. Electronically Signed   By: Titus Dubin M.D.   On: 12/24/2018 10:20   Mr Brain Wo Contrast (neuro Protocol)  Result Date: 12/24/2018 CLINICAL DATA:  TIA, initial exam. Additional history provided: Recent COVID diagnosis. Altered sensations, bilateral arm tingling. EXAM: MRI HEAD WITHOUT CONTRAST TECHNIQUE: Multiplanar, multiecho pulse sequences of the brain and surrounding structures were obtained without intravenous contrast. COMPARISON:  Noncontrast head CT performed earlier the same day 12/24/2018 FINDINGS: Brain: There is no evidence of acute infarct. No evidence of intracranial mass. No midline shift or extra-axial fluid collection. No chronic intracranial blood products. No focal parenchymal signal abnormality is identified. Cerebral volume is normal for age. Vascular: Flow voids maintained within the proximal large arterial vessels. Skull and upper cervical spine: No focal marrow lesion Sinuses/Orbits: Visualized orbits demonstrate no acute abnormality. Trace ethmoid  sinus mucosal thickening. Tiny left maxillary sinus mucous retention cyst. Bilateral mastoid effusions. IMPRESSION: 1. No evidence of acute intracranial abnormality, including acute infarction. 2. Normal MRI appearance of the brain for age. 3. Bilateral mastoid effusions. Electronically Signed   By: Kellie Simmering DO   On: 12/24/2018 15:11   Dg Chest Portable 1 View  Result Date: 12/24/2018 CLINICAL DATA:  Recent COVID-19 positive. Extremity tingling sensation. Headache. EXAM: PORTABLE CHEST 1 VIEW COMPARISON:  None. FINDINGS: Lungs are clear. Heart size and pulmonary vascularity are normal. No adenopathy. No bone lesions. IMPRESSION: No edema or consolidation.  No evident adenopathy. Electronically Signed   By: Lowella Grip III M.D.   On: 12/24/2018 10:06      Subjective: Reports feeling sore in her legs but no tingling numbness or confusion.  Discharge Exam: Vitals:   12/24/18 2045 12/25/18 0440  BP: 118/75 105/68  Pulse: 81 71  Resp: 18 16  Temp: 98.7 F (37.1 C) 98.1 F (36.7 C)  SpO2: 99% 100%   Vitals:   12/24/18 1650 12/24/18 2045 12/25/18 0437 12/25/18 0440  BP: 115/81 118/75  105/68  Pulse: 76 81  71  Resp: 18 18  16   Temp: 98.6 F (37 C) 98.7 F (37.1 C)  98.1 F (36.7 C)  TempSrc: Oral Oral    SpO2: 100% 99%  100%  Weight:   71 kg   Height:        General: Middle-aged female not in distress HEENT: Moist mucosa, supple  neck Chest: Clear bilaterally CVs: Normal S1-S2 GI: Soft, nondistended, nontender Musculoskeletal: Warm, no edema CNS: Alert and oriented, nonfocal   The results of significant diagnostics from this hospitalization (including imaging, microbiology, ancillary and laboratory) are listed below for reference.     Microbiology: Recent Results (from the past 240 hour(s))  SARS CORONAVIRUS 2 (TAT 6-24 HRS) Nasopharyngeal Nasopharyngeal Swab     Status: None   Collection Time: 12/24/18  5:20 PM   Specimen: Nasopharyngeal Swab  Result Value  Ref Range Status   SARS Coronavirus 2 NEGATIVE NEGATIVE Final    Comment: (NOTE) SARS-CoV-2 target nucleic acids are NOT DETECTED. The SARS-CoV-2 RNA is generally detectable in upper and lower respiratory specimens during the acute phase of infection. Negative results do not preclude SARS-CoV-2 infection, do not rule out co-infections with other pathogens, and should not be used as the sole basis for treatment or other patient management decisions. Negative results must be combined with clinical observations, patient history, and epidemiological information. The expected result is Negative. Fact Sheet for Patients: SugarRoll.be Fact Sheet for Healthcare Providers: https://www.woods-mathews.com/ This test is not yet approved or cleared by the [place] FDA and  has been authorized for detection and/or diagnosis of SARS-CoV-2 by FDA under an Emergency Use Authorization (EUA). This EUA will remain  in effect (meaning this test can be used) for the duration of the COVID-19 declaration under Section 56 4(b)(1) of the Act, 21 U.S.C. section 360bbb-3(b)(1), unless the authorization is terminated or revoked sooner. Performed at [facility], [place] [address] [place], [place] [address]      Labs: BNP (last 3 results) No results for input(s): BNP in the last 8760 hours. Basic Metabolic Panel: Recent Labs  Lab 12/24/18 0840 12/24/18 1548 12/25/18 0450  NA 138  --  139  K 3.6  --  3.6  CL 107  --  109  CO2 24  --  21*  GLUCOSE 109*  --  93  BUN 9  --  10  CREATININE 0.75  --  0.63  CALCIUM 9.1  --  8.9  MG  --  2.1  --   PHOS  --  3.8  --    Liver Function Tests: Recent Labs  Lab 12/24/18 0840 12/25/18 0450  AST 17 18  ALT 16 16  ALKPHOS 46 46  BILITOT 0.9 0.6  PROT 7.3 7.1  ALBUMIN 4.5 4.3   No results for input(s): LIPASE, AMYLASE in the last 168 hours. No results for input(s): AMMONIA in the last 168  hours. CBC: Recent Labs  Lab 12/24/18 0840 12/25/18 0450  WBC 5.2 6.5  NEUTROABS 2.8  --   HGB 13.9 14.3  HCT 42.7 44.9  MCV 96.6 99.3  PLT 350 350   Cardiac Enzymes: No results for input(s): CKTOTAL, CKMB, CKMBINDEX, TROPONINI in the last 168 hours. BNP: Invalid input(s): POCBNP CBG: Recent Labs  Lab 12/25/18 0808  GLUCAP 101*   D-Dimer No results for input(s): DDIMER in the last 72 hours. Hgb A1c No results for input(s): HGBA1C in the last 72 hours. Lipid Profile No results for input(s): CHOL, HDL, LDLCALC, TRIG, CHOLHDL, LDLDIRECT in the last 72 hours. Thyroid function studies Recent Labs    12/24/18 1548  TSH 0.667   Anemia work up Recent Labs    12/24/18 1548  VITAMINB12 629   Urinalysis    Component Value Date/Time   COLORURINE YELLOW 12/24/2018 [place] 12/24/2018 1730   LABSPEC 1.010  12/24/2018 1730   PHURINE 6.0 12/24/2018 1730   GLUCOSEU NEGATIVE 12/24/2018 1730   HGBUR NEGATIVE 12/24/2018 [place] 12/24/2018 1730   KETONESUR 5 (A) 12/24/2018 1730   PROTEINUR NEGATIVE 12/24/2018 1730   NITRITE NEGATIVE 12/24/2018 1730   LEUKOCYTESUR NEGATIVE 12/24/2018 1730   Sepsis Labs Invalid input(s): PROCALCITONIN,  WBC,  LACTICIDVEN Microbiology Recent Results (from the past 240 hour(s))  SARS CORONAVIRUS 2 (TAT 6-24 HRS) Nasopharyngeal Nasopharyngeal Swab     Status: None   Collection Time: 12/24/18  5:20 PM   Specimen: Nasopharyngeal Swab  Result Value Ref Range Status   SARS Coronavirus 2 NEGATIVE NEGATIVE Final    Comment: (NOTE) SARS-CoV-2 target nucleic acids are NOT DETECTED. The SARS-CoV-2 RNA is generally detectable in upper and lower respiratory specimens during the acute phase of infection. Negative results do not preclude SARS-CoV-2 infection, do not rule out co-infections with other pathogens, and should not be used as the sole basis for treatment or other patient management decisions. Negative  results must be combined with clinical observations, patient history, and epidemiological information. The expected result is Negative. Fact Sheet for Patients: SugarRoll.be Fact Sheet for Healthcare Providers: https://www.woods-mathews.com/ This test is not yet approved or cleared by the [place] FDA and  has been authorized for detection and/or diagnosis of SARS-CoV-2 by FDA under an Emergency Use Authorization (EUA). This EUA will remain  in effect (meaning this test can be used) for the duration of the COVID-19 declaration under Section 56 4(b)(1) of the Act, 21 U.S.C. section 360bbb-3(b)(1), unless the authorization is terminated or revoked sooner. Performed at [facility], [place] [address] [place], [place] [address]      Time coordinating discharge: <30 minutes  SIGNED:   Louellen Molder, MD  [facility] 12/25/2018, 10:36 AM Pager   If 7PM-7AM, please contact night-coverage [email or web address] Password [email or web address]

## 2018-12-25 NOTE — Plan of Care (Signed)
Patient discharged home in stable condition 

## 2018-12-25 NOTE — Evaluation (Signed)
Physical Therapy Evaluation Patient Details Name: Vicki Castro MRN: [id number] DOB: Nov 03, 1975 Today's Date: 12/25/2018   History of Present Illness  43 y.o. female with medical history significant of recently diagnosed Covid on 12/09/2018, hypothyroidism, history of Hashimoto's, generalized anxiety disorder, migraines who presents to the ED with complaints of buzzing in the head, bilateral tingling in the upper extremities which started 12/20/2018, an episode of staring where she felt she was present yet not present which lasted approximately a minute, increased hyper awareness, right shoulder tingling and right upper extremity transient weakness.  Clinical Impression  Pt ambulated 200' without an assistive device, no loss of balance, distance limited by fatigue, SaO2 100% on room air. Pt reports resolution of tingling since taking gabapentin. She reports generalized fatigue, LE stiffness and some difficulty with cognitive processing. Encouraged pt to gradually progress ambulation to progress activity tolerance. She declined outpatient/HHPT as she feels she can progress activity at home independently. Pt is safe to ambulate in halls independently and was encouraged to do so TID to minimize deconditioning during hospitalization. No further acute PT indicated as pt is independent with mobility.     Follow Up Recommendations No PT follow up    Equipment Recommendations  None recommended by PT    Recommendations for Other Services       Precautions / Restrictions Precautions Precautions: Fall Precaution Comments: pt had buckling of LLE 1 1/2 weeks ago, she lowered herself to the floor Restrictions Weight Bearing Restrictions: No      Mobility  Bed Mobility Overal bed mobility: Independent                Transfers Overall transfer level: Independent                  Ambulation/Gait Ambulation/Gait assistance: Independent Gait Distance (Feet): 200 Feet Assistive  device: None Gait Pattern/deviations: WFL(Within Functional Limits) Gait velocity: WFL   General Gait Details: steady, no loss of balance, SaO2 100% on room air walking, 1-2/4 dyspnea, distance limited by fatigue  Stairs            Wheelchair Mobility    Modified Rankin (Stroke Patients Only)       Balance Overall balance assessment: Independent                                           Pertinent Vitals/Pain Pain Assessment: No/denies pain    Home Living Family/patient expects to be discharged to:: Private residence Living Arrangements: Spouse/significant other;Children Available Help at Discharge: Family;Available 24 hours/day           Home Equipment: Cane - single point      Prior Function Level of Independence: Independent with assistive device(s)         Comments: has been using SPC since LLE buckled 1 1/2 weeks ago; 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 Sensation intact to light touch BLEs.)    Cervical / Trunk Assessment Cervical / Trunk Assessment: Normal  Communication  Communication: No difficulties  Cognition Arousal/Alertness: Awake/alert Behavior During Therapy: WFL for tasks assessed/performed Overall Cognitive Status: Impaired/Different from baseline Area of Impairment: Memory                               General Comments: since onset of covid, pt reports word finding difficulty, some short term memory deficits. Pt able to follow commands and is A&O x 4      General Comments  General comments (skin integrity, edema, etc.): pt denied dizziness throughout eval    Exercises     Assessment/Plan    PT Assessment Patent does not need any further PT services  PT Problem List         PT Treatment Interventions      PT Goals (Current goals can be found in the Care Plan section)  Acute Rehab PT Goals Patient Stated Goal: return to doing yoga PT Goal Formulation: All assessment and education complete, DC therapy    Frequency     Barriers to discharge        Co-evaluation               AM-PAC PT "6 Clicks" Mobility  Outcome Measure Help needed turning from your back to your side while in a flat bed without using bedrails?: None Help needed moving from lying on your back to sitting on the side of a flat bed without using bedrails?: None Help needed moving to and from a bed to a chair (including a wheelchair)?: None Help needed standing up from a chair using your arms (e.g., wheelchair or bedside chair)?: None Help needed to walk in hospital room?: None Help needed climbing 3-5 steps with a railing? : None 6 Click Score: 24    End of Session Equipment Utilized During Treatment: Gait belt Activity Tolerance: Patient tolerated treatment well Patient left: in chair;with call bell/phone within reach Nurse Communication: Mobility status      Time: [id number] PT Time Calculation (min) (ACUTE ONLY): 24 min   Charges:   PT Evaluation $PT Eval Low Complexity: 1 Low PT Treatments $Gait Training: 8-22 mins        Blondell Reveal Kistler PT 12/25/2018  Acute Rehabilitation Services Pager [phone] Office [phone]

## 2018-12-26 ENCOUNTER — Other Ambulatory Visit (HOSPITAL_COMMUNITY): Payer: Managed Care, Other (non HMO)

## 2018-12-30 ENCOUNTER — Encounter (HOSPITAL_BASED_OUTPATIENT_CLINIC_OR_DEPARTMENT_OTHER): Payer: Self-pay

## 2018-12-30 ENCOUNTER — Ambulatory Visit (HOSPITAL_BASED_OUTPATIENT_CLINIC_OR_DEPARTMENT_OTHER): Admit: 2018-12-30 | Payer: Managed Care, Other (non HMO) | Admitting: Surgery

## 2018-12-30 SURGERY — BREAST LUMPECTOMY WITH RADIOACTIVE SEED LOCALIZATION
Anesthesia: General | Site: Breast | Laterality: Left

## 2019-06-02 ENCOUNTER — Other Ambulatory Visit: Payer: Self-pay | Admitting: Surgery

## 2019-06-02 DIAGNOSIS — N6022 Fibroadenosis of left breast: Secondary | ICD-10-CM

## 2021-03-04 IMAGING — MG MM BREAST BX W LOC DEV 1ST LESION IMAGE BX SPEC STEREO GUIDE*L*
8 of 15 series · 8 of 23 positions shown · non-contrast
Comparison: Previous exams.
COMPARISON: Previous exams.

Addendum:
CLINICAL DATA: 43-year-old female for tissue sampling of OUTER
slightly UPPER LEFT breast distortion.

EXAM:
LEFT BREAST STEREOTACTIC CORE NEEDLE BIOPSY

[L (1 of 8)]
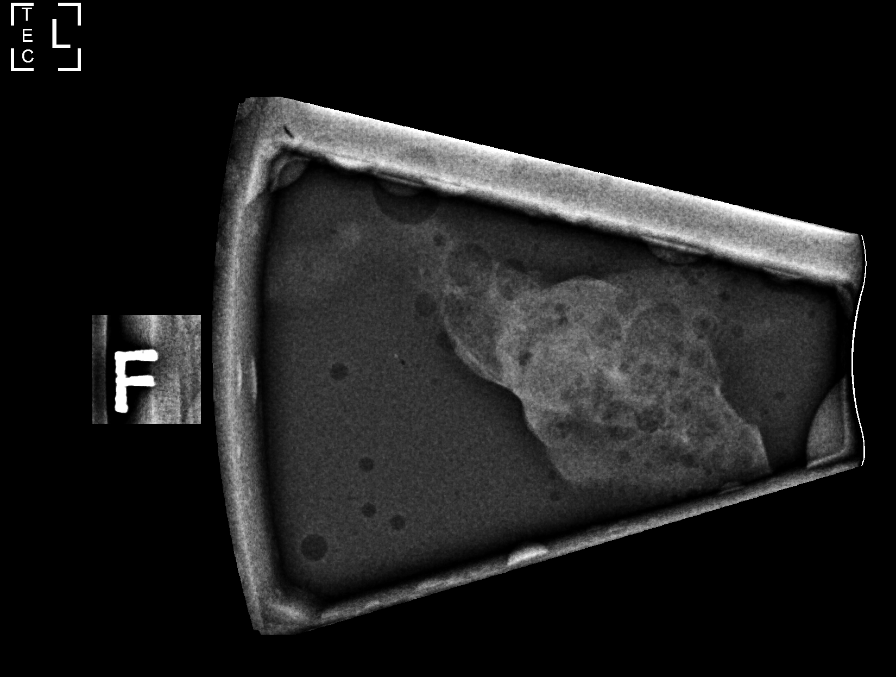

[L (2 of 8)]
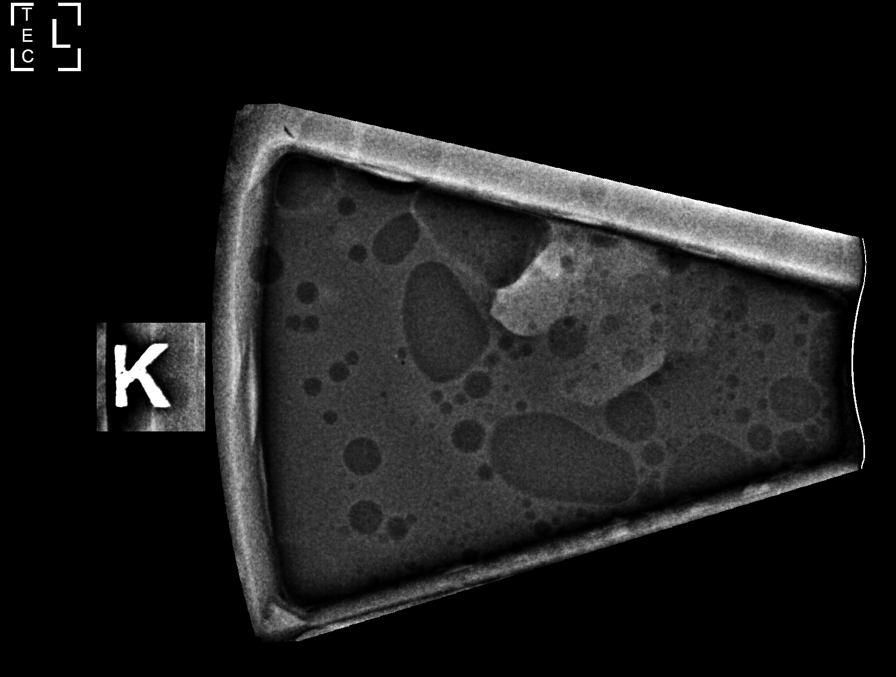

[L (3 of 8)]
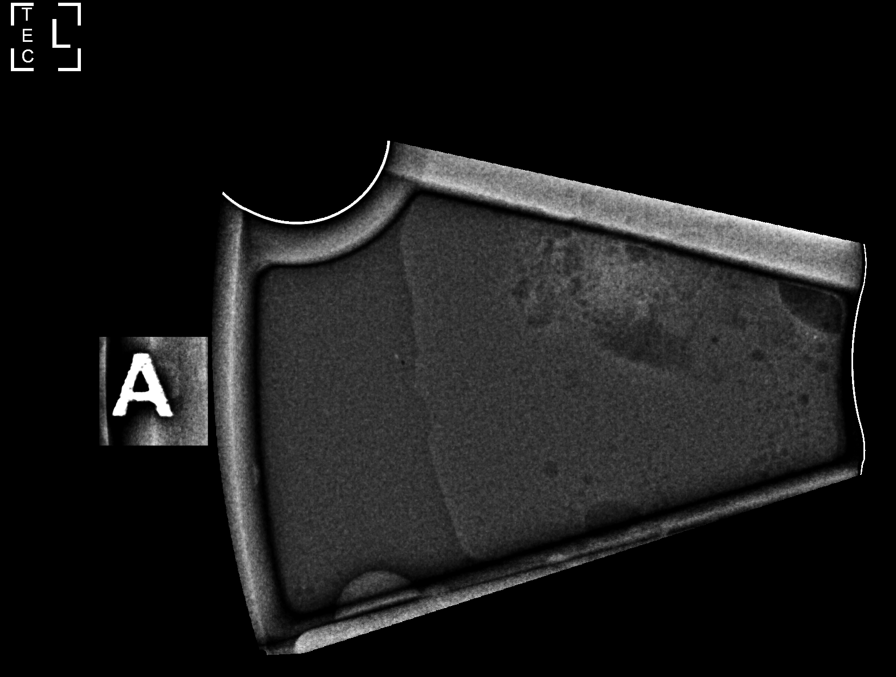

[L (4 of 8)]
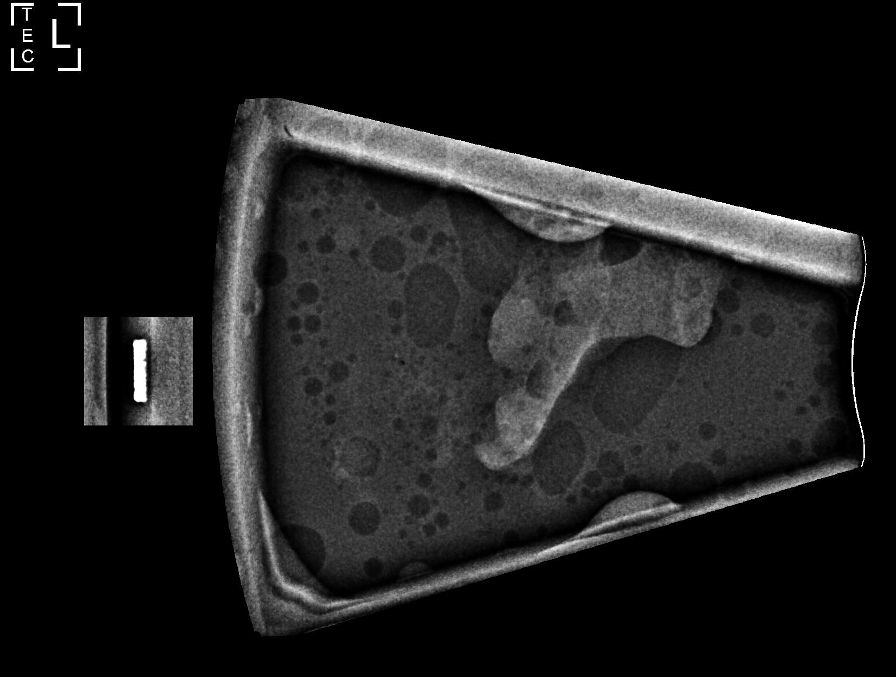

[L (5 of 8)]
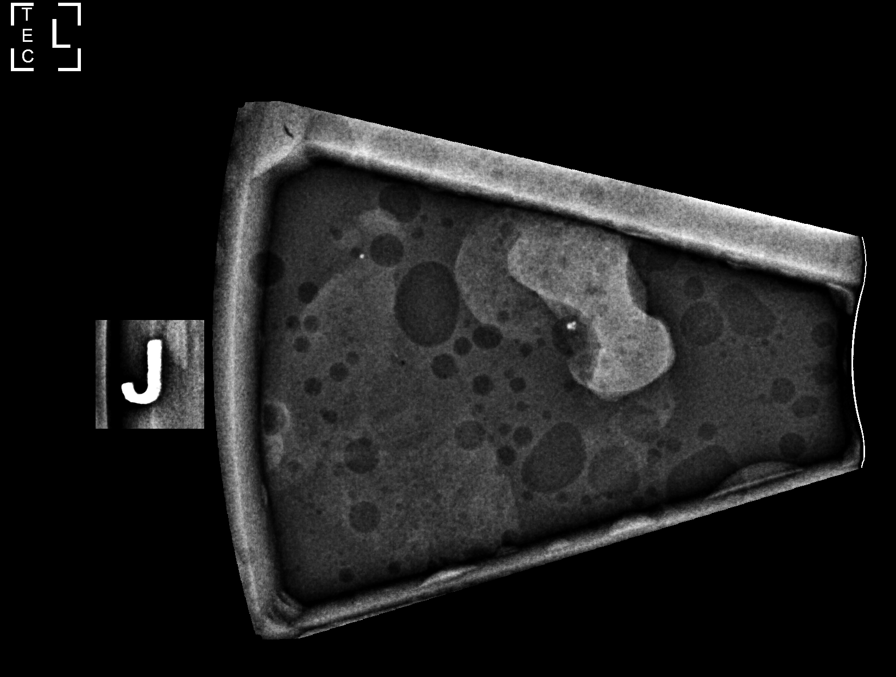

[L (6 of 8)]
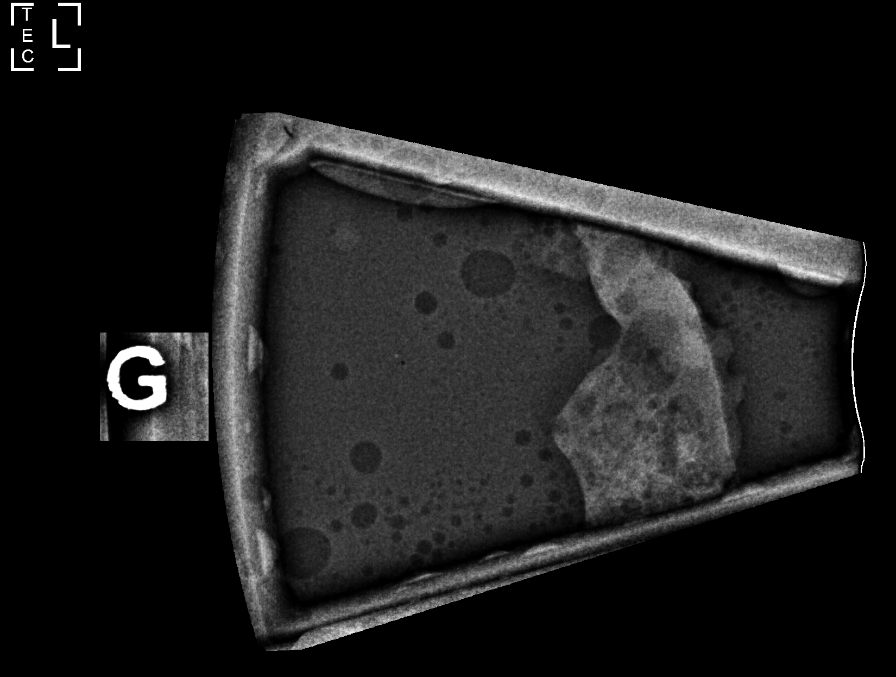

[L (7 of 8)]
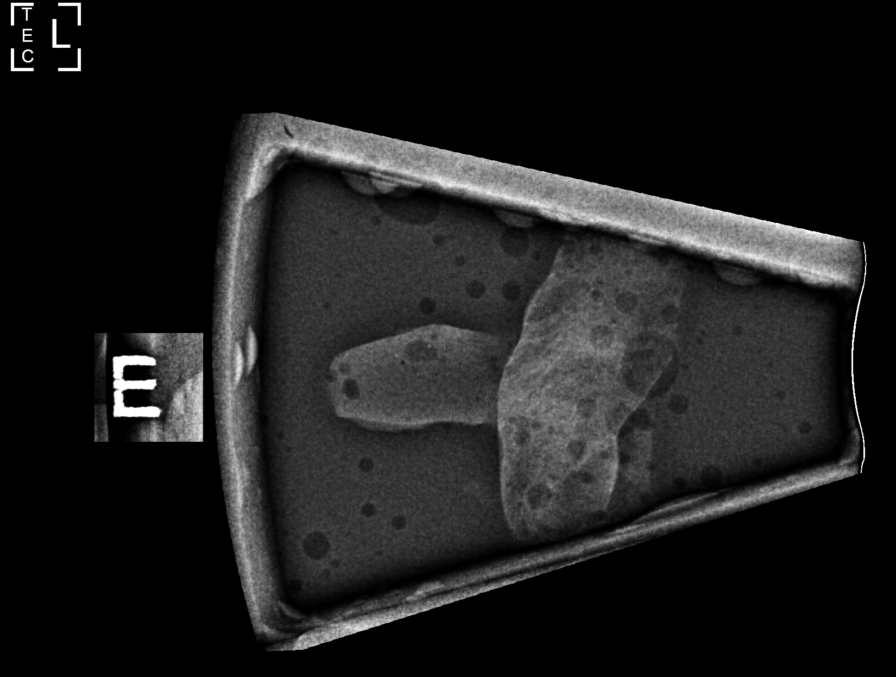

[L (8 of 8)]
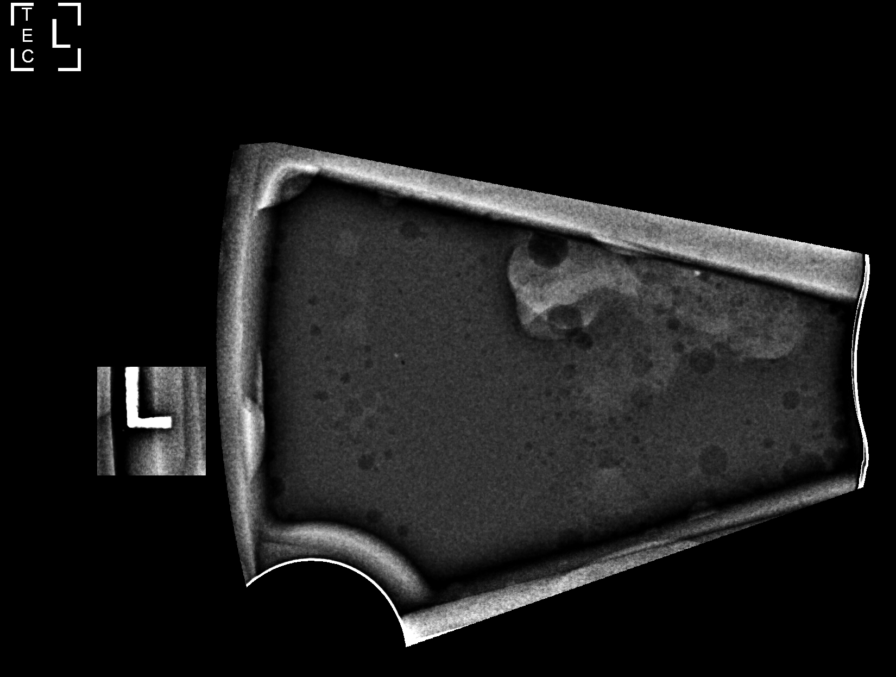

[8 of 23 positions shown; findings below may reference images not displayed]



Using sterile technique and 1% Lidocaine as local anesthetic, under
stereotactic guidance, a 9 gauge vacuum assisted device was used to
perform core needle biopsy of distortion within the OUTER slightly
UPPER LEFT breast using a SUPERIOR approach.

Lesion quadrant: UPPER-OUTER LEFT breast

At the conclusion of the procedure, COIL tissue marker clip was
deployed into the biopsy cavity. Follow-up 2-view mammogram was
performed and dictated separately.
IMPRESSION: Stereotactic-guided biopsy of OUTER slightly UPPER LEFT breast
distortion. No apparent complications.

ADDENDUM:
Pathology revealed COMPLEX SCLEROSING LESION, SMALL INTRADUCTAL
PAPILLOMA, FIBROCYSTIC CHANGE WITH ADENOSIS AND CALCIFICATIONS,
NEGATIVE FOR CARCINOMA- of the LEFT breast, upper outer. This was
found to be concordant by Dr. Livjo Rigoli, with excision recommended.

Pathology results were discussed with the patient by telephone. The
patient reported doing well after the biopsy with tenderness at the
site. Post biopsy instructions and care were reviewed and questions
were answered. The patient was encouraged to call The [REDACTED]

Surgical consultation has been arranged with Dr. Adarsh Tiger at
[REDACTED] on November 17, 2018.

Pathology results reported by Efrem Allmon, RN on 11/03/2018.



Using sterile technique and 1% Lidocaine as local anesthetic, under
stereotactic guidance, a 9 gauge vacuum assisted device was used to
perform core needle biopsy of distortion within the OUTER slightly
UPPER LEFT breast using a SUPERIOR approach.

Lesion quadrant: UPPER-OUTER LEFT breast

At the conclusion of the procedure, COIL tissue marker clip was
deployed into the biopsy cavity. Follow-up 2-view mammogram was
performed and dictated separately.
IMPRESSION: Stereotactic-guided biopsy of OUTER slightly UPPER LEFT breast
distortion. No apparent complications.
